# Patient Record
Sex: Male | Born: 1965 | Race: Black or African American | Hispanic: No | Marital: Married
Health system: Southern US, Community
[De-identification: ages and names within clinical notes are randomized; demographics above are authoritative.]

## PROBLEM LIST (undated history)

## (undated) DIAGNOSIS — N135 Crossing vessel and stricture of ureter without hydronephrosis: Secondary | ICD-10-CM

---

## 2001-09-01 ENCOUNTER — Emergency Department (HOSPITAL_COMMUNITY): Admission: EM | Admit: 2001-09-01 | Discharge: 2001-09-01 | Payer: Self-pay | Admitting: Emergency Medicine

## 2001-09-03 ENCOUNTER — Emergency Department (HOSPITAL_COMMUNITY): Admission: EM | Admit: 2001-09-03 | Discharge: 2001-09-03 | Payer: Self-pay | Admitting: Emergency Medicine

## 2010-10-02 ENCOUNTER — Ambulatory Visit
Admission: RE | Admit: 2010-10-02 | Discharge: 2010-10-02 | Disposition: A | Discharge status: Home / Self Care | Payer: BC Managed Care – PPO | Source: Ambulatory Visit | Attending: Family Medicine | Admitting: Family Medicine

## 2010-10-02 ENCOUNTER — Other Ambulatory Visit: Payer: Self-pay | Admitting: Family Medicine

## 2010-10-02 MED ORDER — IOHEXOL 300 MG/ML  SOLN
100.0000 mL | Freq: Once | INTRAMUSCULAR | Status: AC | PRN
  Administered 2010-10-02: 100 mL via INTRAVENOUS

## 2010-10-09 ENCOUNTER — Other Ambulatory Visit (HOSPITAL_COMMUNITY): Payer: Self-pay | Admitting: Urology

## 2010-10-09 DIAGNOSIS — N135 Crossing vessel and stricture of ureter without hydronephrosis: Secondary | ICD-10-CM

## 2010-10-09 DIAGNOSIS — N289 Disorder of kidney and ureter, unspecified: Secondary | ICD-10-CM

## 2010-10-16 ENCOUNTER — Encounter (HOSPITAL_COMMUNITY): Payer: Self-pay

## 2010-10-16 ENCOUNTER — Encounter (HOSPITAL_COMMUNITY)
Admission: RE | Admit: 2010-10-16 | Discharge: 2010-10-16 | Disposition: A | Discharge status: Home / Self Care | Payer: BC Managed Care – PPO | Source: Ambulatory Visit | Attending: Urology | Admitting: Urology

## 2010-10-16 DIAGNOSIS — N135 Crossing vessel and stricture of ureter without hydronephrosis: Secondary | ICD-10-CM | POA: Insufficient documentation

## 2010-10-16 DIAGNOSIS — N289 Disorder of kidney and ureter, unspecified: Secondary | ICD-10-CM | POA: Insufficient documentation

## 2010-10-16 HISTORY — DX: Crossing vessel and stricture of ureter without hydronephrosis: N13.5

## 2010-10-16 MED ORDER — TECHNETIUM TC 99M MERTIATIDE
15.5000 | Freq: Once | INTRAVENOUS | Status: AC | PRN
  Administered 2010-10-16: 15.5 via INTRAVENOUS

## 2010-10-16 MED ORDER — FUROSEMIDE 10 MG/ML IJ SOLN: 34.0000 mg | Freq: Once | INTRAMUSCULAR | Status: DC

## 2012-06-20 ENCOUNTER — Ambulatory Visit
Admission: RE | Admit: 2012-06-20 | Discharge: 2012-06-20 | Disposition: A | Discharge status: Home / Self Care | Payer: BC Managed Care – PPO | Source: Ambulatory Visit | Attending: Obstetrics and Gynecology | Admitting: Obstetrics and Gynecology

## 2012-06-20 ENCOUNTER — Other Ambulatory Visit: Payer: Self-pay | Admitting: Obstetrics and Gynecology

## 2012-06-20 DIAGNOSIS — R1032 Left lower quadrant pain: Secondary | ICD-10-CM

## 2013-12-03 IMAGING — CT CT ABD-PELV W/O CM
2 of 4 series · 17 of 46 positions shown, 19 images · non-contrast
Comparison: 10/02/2010.

CLINICAL DATA: Left sided abdominal pain.  Left groin and left
testicular pain.  Hematuria.

CT ABDOMEN AND PELVIS WITHOUT CONTRAST
TECHNIQUE: Multidetector CT imaging of the abdomen and pelvis was
performed following the standard protocol without intravenous
contrast.

[Series 2: renal stone w/o · axial · non-contrast · 0.73mm/px · z∈[-402,-27]mm · 14 of 83 slices shown, 16 images]
[im 4/83  soft-tissue]
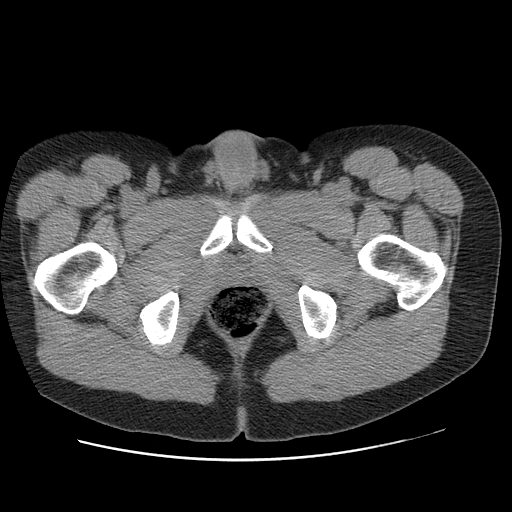
[im 4/83  bone]
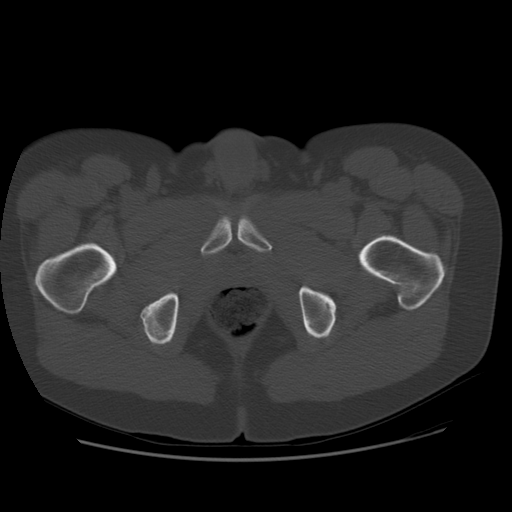
[im 11/83  soft-tissue]
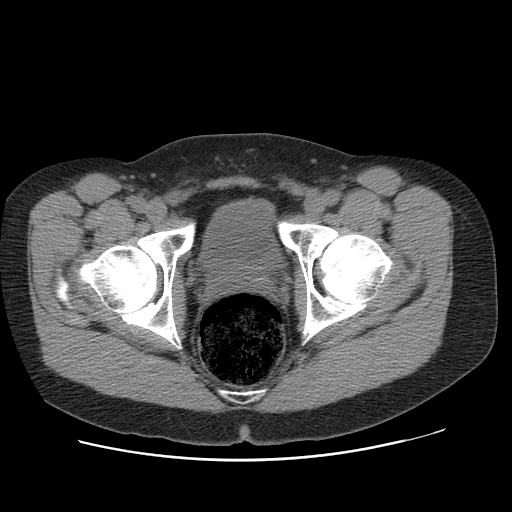
[im 18/83  soft-tissue]
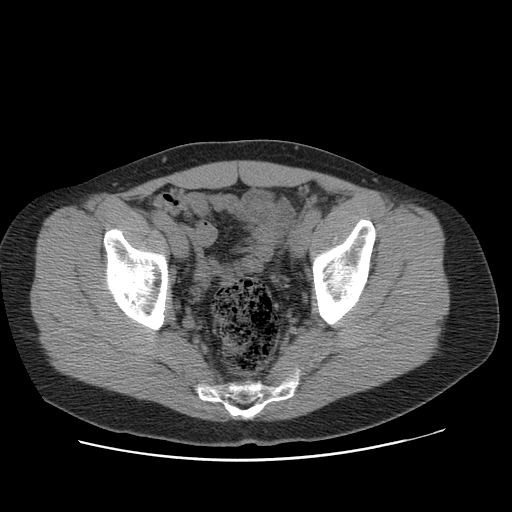
[im 21/83  soft-tissue]
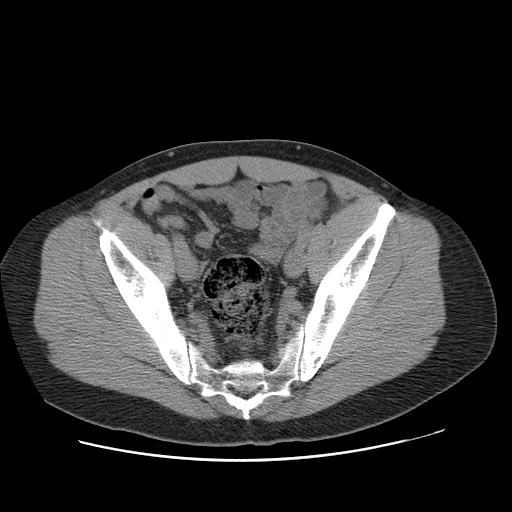
[im 28/83  soft-tissue]
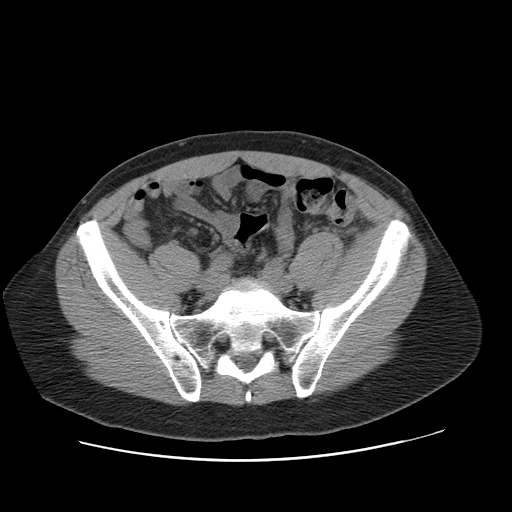
[im 35/83  soft-tissue]
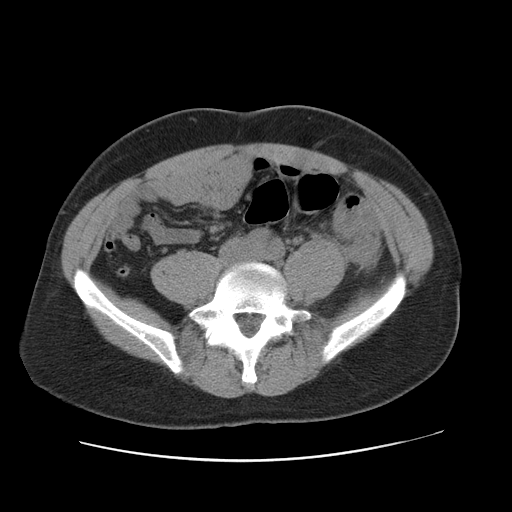
[im 38/83  soft-tissue]
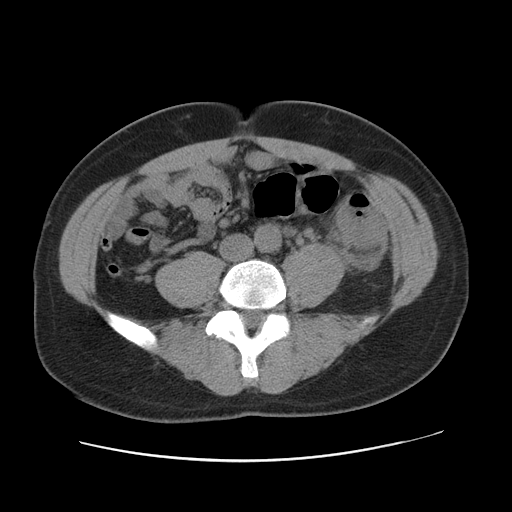
[im 45/83  soft-tissue]
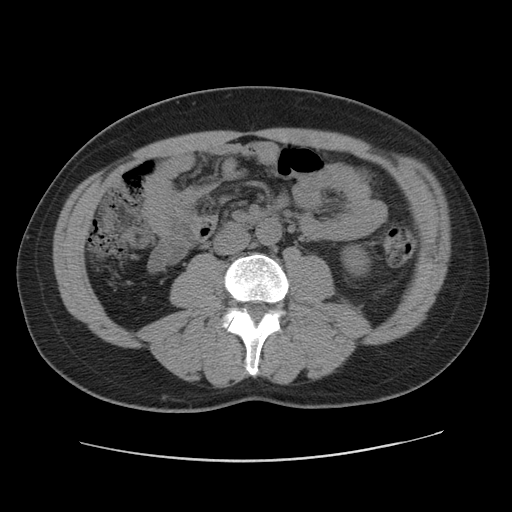
[im 48/83  soft-tissue]
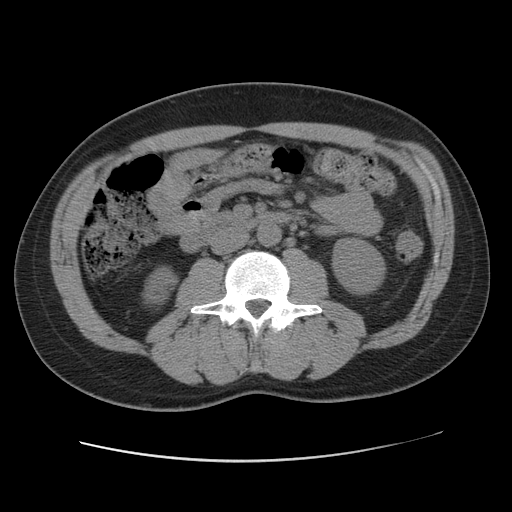
[im 48/83  bone]
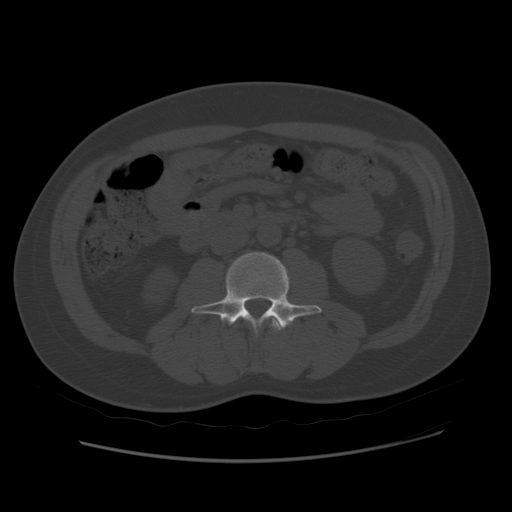
[im 55/83  soft-tissue]
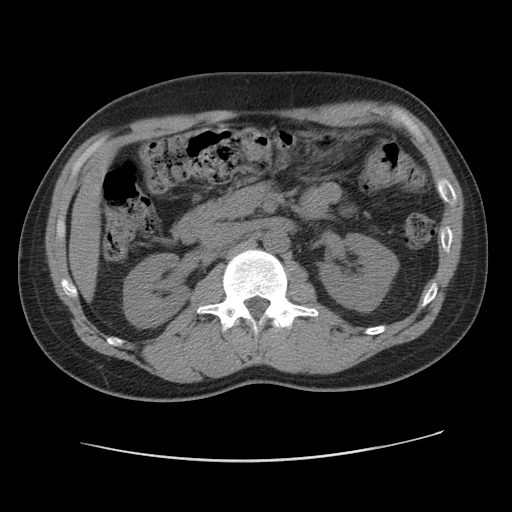
[im 62/83  soft-tissue]
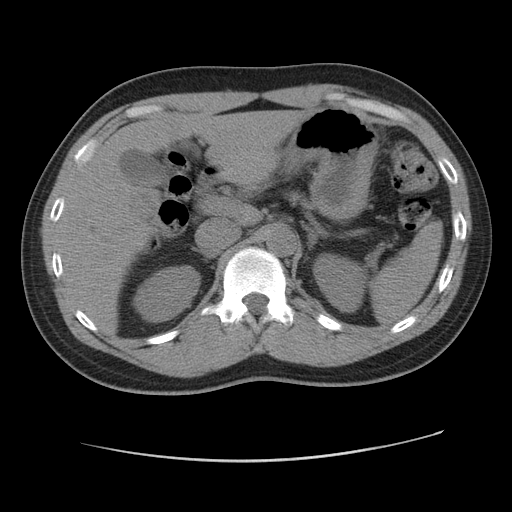
[im 65/83  soft-tissue]
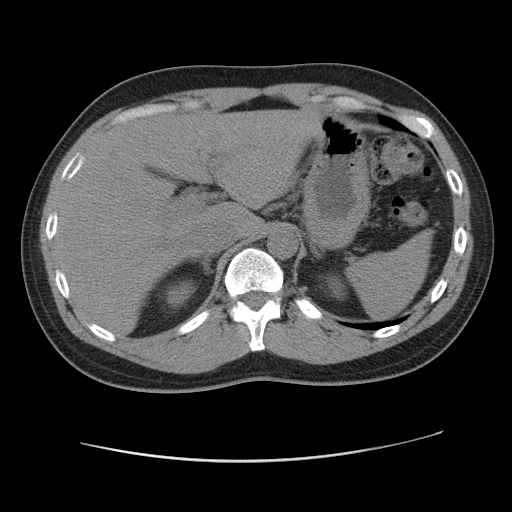
[im 72/83  soft-tissue]
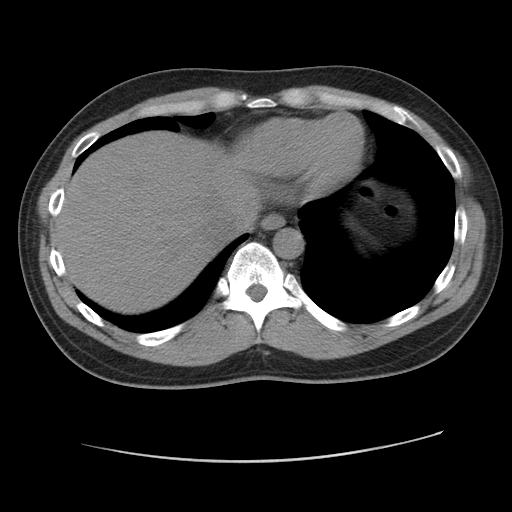
[im 79/83  soft-tissue]
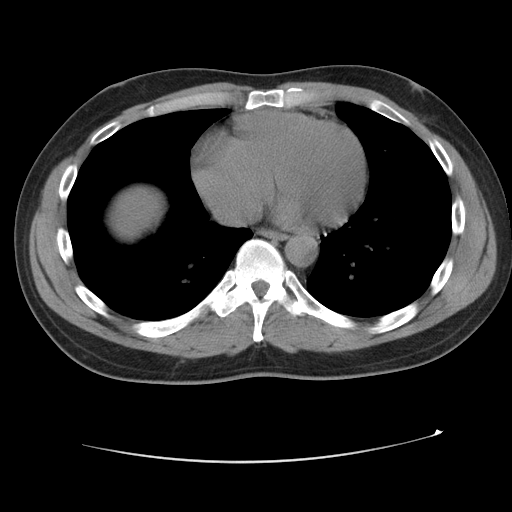

[Series 400: cor · coronal · 0.90mm/px · 3 of 138 slices shown]
[im 46/138  soft-tissue]
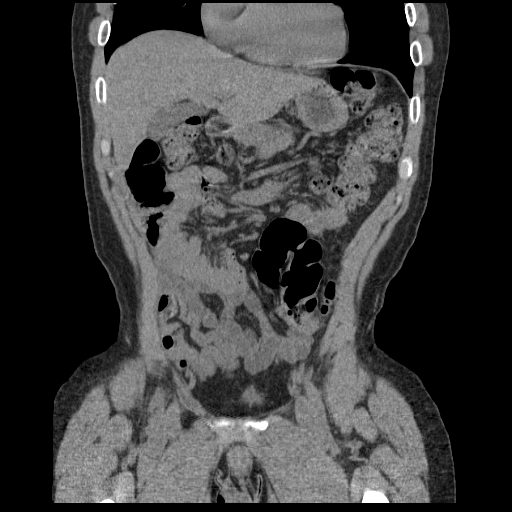
[im 61/138  soft-tissue]
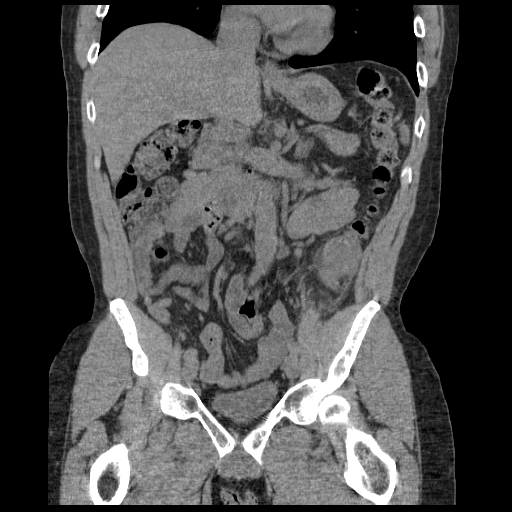
[im 77/138  soft-tissue]
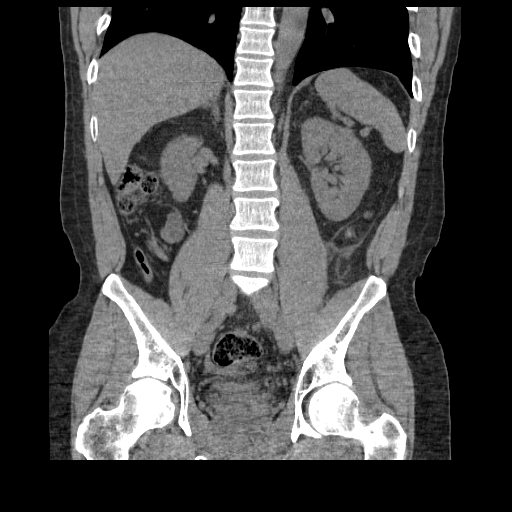

[17 of 46 positions shown; findings below may reference images not displayed]

FINDINGS: Lung bases show no acute findings.  Heart size normal.
No pericardial or pleural effusion.

A low attenuation lesion in the right hepatic lobe measures 2.8 cm
and corresponds to a probable hemangioma on 10/02/2010.  Sub
centimeter low attenuation lesion in the right hepatic lobe is too
small to characterize.  Liver, gallbladder and adrenal glands are
otherwise unremarkable.  Low attenuation lesions in the right
kidney measure up to approximately 11 mm, as before.  Kidneys,
spleen, pancreas, stomach and small bowel are otherwise
unremarkable.

Pericolonic edema and stranding are seen adjacent to the distal
descending colon (image 48).  Appendix and colon are otherwise
unremarkable.  No pathologically enlarged lymph nodes.  No free
fluid.  No worrisome lytic or sclerotic lesions.
IMPRESSION: 1.  Diverticulitis involving the descending colon.
2.  No genitourinary findings to explain the patient's given
symptoms.

## 2019-06-17 ENCOUNTER — Ambulatory Visit: Payer: Self-pay | Attending: Internal Medicine

## 2019-06-17 DIAGNOSIS — Z23 Encounter for immunization: Secondary | ICD-10-CM | POA: Insufficient documentation

## 2019-06-17 NOTE — Progress Notes (Signed)
   Covid-19 Vaccination Clinic  Name:  Darrell Vance    MRN: [id number] DOB: 10-Jun-1965  06/17/2019  Darrell Vance was observed post Covid-19 immunization for 15 minutes without incident. He was provided with Vaccine Information Sheet and instruction to access the V-Safe system.   Darrell Vance was instructed to call 911 with any severe reactions post vaccine: Marland Kitchen Difficulty breathing  . Swelling of face and throat  . A fast heartbeat  . A bad rash all over body  . Dizziness and weakness   Immunizations Administered    Name Date Dose VIS Date Route   Pfizer COVID-19 Vaccine 06/17/2019  1:31 PM 0.3 mL 03/24/2019 Intramuscular   Manufacturer: [employer], [employer]   Lot: [id number]   NDC: [address]-1

## 2019-07-08 ENCOUNTER — Ambulatory Visit: Payer: Self-pay | Attending: Internal Medicine

## 2019-07-08 DIAGNOSIS — Z23 Encounter for immunization: Secondary | ICD-10-CM

## 2019-07-08 NOTE — Progress Notes (Signed)
   Covid-19 Vaccination Clinic  Name:  ATHANASIOS HELDMAN    MRN: [id number] DOB: 1965/11/27  07/08/2019  Mr. Mcbryar was observed post Covid-19 immunization for 15 minutes without incident. He was provided with Vaccine Information Sheet and instruction to access the V-Safe system.   Mr. Sek was instructed to call 911 with any severe reactions post vaccine: Marland Kitchen Difficulty breathing  . Swelling of face and throat  . A fast heartbeat  . A bad rash all over body  . Dizziness and weakness   Immunizations Administered    Name Date Dose VIS Date Route   Pfizer COVID-19 Vaccine 07/08/2019 10:09 AM 0.3 mL 03/24/2019 Intramuscular   Manufacturer: [employer], [employer]   Lot: [id number]   NDC: [address]-3

## 2023-08-20 DIAGNOSIS — I872 Venous insufficiency (chronic) (peripheral): Secondary | ICD-10-CM | POA: Diagnosis not present

## 2023-08-20 DIAGNOSIS — L810 Postinflammatory hyperpigmentation: Secondary | ICD-10-CM | POA: Diagnosis not present

## 2023-08-20 DIAGNOSIS — L2084 Intrinsic (allergic) eczema: Secondary | ICD-10-CM | POA: Diagnosis not present

## 2023-08-23 DIAGNOSIS — Z13220 Encounter for screening for lipoid disorders: Secondary | ICD-10-CM | POA: Diagnosis not present

## 2023-08-23 DIAGNOSIS — Z23 Encounter for immunization: Secondary | ICD-10-CM | POA: Diagnosis not present

## 2023-08-23 DIAGNOSIS — Z0000 Encounter for general adult medical examination without abnormal findings: Secondary | ICD-10-CM | POA: Diagnosis not present

## 2023-08-23 DIAGNOSIS — E559 Vitamin D deficiency, unspecified: Secondary | ICD-10-CM | POA: Diagnosis not present

## 2023-08-23 DIAGNOSIS — Z125 Encounter for screening for malignant neoplasm of prostate: Secondary | ICD-10-CM | POA: Diagnosis not present

## 2023-08-23 DIAGNOSIS — E66811 Obesity, class 1: Secondary | ICD-10-CM | POA: Diagnosis not present

## 2023-08-23 DIAGNOSIS — R7303 Prediabetes: Secondary | ICD-10-CM | POA: Diagnosis not present
